# Patient Record
Sex: Male | Born: 1935 | Race: White | Hispanic: No | Marital: Married | Smoking: Never smoker
Health system: Southern US, Community
[De-identification: ages and names within clinical notes are randomized; demographics above are authoritative.]

## PROBLEM LIST (undated history)

## (undated) DIAGNOSIS — E7800 Pure hypercholesterolemia, unspecified: Secondary | ICD-10-CM

## (undated) DIAGNOSIS — L97509 Non-pressure chronic ulcer of other part of unspecified foot with unspecified severity: Secondary | ICD-10-CM

## (undated) DIAGNOSIS — I2510 Atherosclerotic heart disease of native coronary artery without angina pectoris: Secondary | ICD-10-CM

## (undated) DIAGNOSIS — C801 Malignant (primary) neoplasm, unspecified: Secondary | ICD-10-CM

## (undated) DIAGNOSIS — E119 Type 2 diabetes mellitus without complications: Secondary | ICD-10-CM

## (undated) DIAGNOSIS — E11621 Type 2 diabetes mellitus with foot ulcer: Secondary | ICD-10-CM

## (undated) DIAGNOSIS — E1140 Type 2 diabetes mellitus with diabetic neuropathy, unspecified: Secondary | ICD-10-CM

## (undated) DIAGNOSIS — I4891 Unspecified atrial fibrillation: Secondary | ICD-10-CM

## (undated) DIAGNOSIS — I10 Essential (primary) hypertension: Secondary | ICD-10-CM

## (undated) HISTORY — PX: PACEMAKER PLACEMENT: SHX43

## (undated) HISTORY — PX: CORONARY ARTERY BYPASS GRAFT: SHX141

## (undated) HISTORY — PX: TOTAL HIP ARTHROPLASTY: SHX124

## (undated) HISTORY — PX: CORONARY STENT PLACEMENT: SHX1402

---

## 2014-02-09 ENCOUNTER — Encounter (HOSPITAL_BASED_OUTPATIENT_CLINIC_OR_DEPARTMENT_OTHER): Payer: Self-pay | Admitting: Emergency Medicine

## 2014-02-09 ENCOUNTER — Emergency Department (HOSPITAL_BASED_OUTPATIENT_CLINIC_OR_DEPARTMENT_OTHER): Payer: Medicare Other

## 2014-02-09 ENCOUNTER — Emergency Department (HOSPITAL_BASED_OUTPATIENT_CLINIC_OR_DEPARTMENT_OTHER)
Admission: EM | Admit: 2014-02-09 | Discharge: 2014-02-09 | Disposition: A | Discharge status: Home / Self Care | Payer: Medicare Other | Attending: Emergency Medicine | Admitting: Emergency Medicine

## 2014-02-09 DIAGNOSIS — I6782 Cerebral ischemia: Secondary | ICD-10-CM | POA: Insufficient documentation

## 2014-02-09 DIAGNOSIS — W01198A Fall on same level from slipping, tripping and stumbling with subsequent striking against other object, initial encounter: Secondary | ICD-10-CM | POA: Insufficient documentation

## 2014-02-09 DIAGNOSIS — E1140 Type 2 diabetes mellitus with diabetic neuropathy, unspecified: Secondary | ICD-10-CM | POA: Diagnosis not present

## 2014-02-09 DIAGNOSIS — Z7951 Long term (current) use of inhaled steroids: Secondary | ICD-10-CM | POA: Diagnosis not present

## 2014-02-09 DIAGNOSIS — Z792 Long term (current) use of antibiotics: Secondary | ICD-10-CM | POA: Diagnosis not present

## 2014-02-09 DIAGNOSIS — Y9389 Activity, other specified: Secondary | ICD-10-CM | POA: Diagnosis not present

## 2014-02-09 DIAGNOSIS — S52042A Displaced fracture of coronoid process of left ulna, initial encounter for closed fracture: Secondary | ICD-10-CM | POA: Diagnosis not present

## 2014-02-09 DIAGNOSIS — Z79899 Other long term (current) drug therapy: Secondary | ICD-10-CM | POA: Insufficient documentation

## 2014-02-09 DIAGNOSIS — S6992XA Unspecified injury of left wrist, hand and finger(s), initial encounter: Secondary | ICD-10-CM | POA: Diagnosis not present

## 2014-02-09 DIAGNOSIS — I10 Essential (primary) hypertension: Secondary | ICD-10-CM | POA: Insufficient documentation

## 2014-02-09 DIAGNOSIS — I4891 Unspecified atrial fibrillation: Secondary | ICD-10-CM | POA: Insufficient documentation

## 2014-02-09 DIAGNOSIS — S59902A Unspecified injury of left elbow, initial encounter: Secondary | ICD-10-CM | POA: Diagnosis present

## 2014-02-09 DIAGNOSIS — Y92128 Other place in nursing home as the place of occurrence of the external cause: Secondary | ICD-10-CM | POA: Insufficient documentation

## 2014-02-09 DIAGNOSIS — E780 Pure hypercholesterolemia: Secondary | ICD-10-CM | POA: Diagnosis not present

## 2014-02-09 DIAGNOSIS — W19XXXA Unspecified fall, initial encounter: Secondary | ICD-10-CM

## 2014-02-09 DIAGNOSIS — Z7901 Long term (current) use of anticoagulants: Secondary | ICD-10-CM | POA: Insufficient documentation

## 2014-02-09 HISTORY — DX: Unspecified atrial fibrillation: I48.91

## 2014-02-09 HISTORY — DX: Type 2 diabetes mellitus with foot ulcer: E11.621

## 2014-02-09 HISTORY — DX: Non-pressure chronic ulcer of other part of unspecified foot with unspecified severity: L97.509

## 2014-02-09 HISTORY — DX: Type 2 diabetes mellitus without complications: E11.9

## 2014-02-09 HISTORY — DX: Type 2 diabetes mellitus with diabetic neuropathy, unspecified: E11.40

## 2014-02-09 HISTORY — DX: Essential (primary) hypertension: I10

## 2014-02-09 HISTORY — DX: Pure hypercholesterolemia, unspecified: E78.00

## 2014-02-09 NOTE — ED Notes (Signed)
Pt refused PT with INR.  Pt states he had it drawn last week and it was 2.2  Pt states he normally runs 2.2 to 2.4 and there was no changes in his Coumadin.  Robin notified.

## 2014-02-09 NOTE — ED Notes (Signed)
Patient requested that PT not be drawn.  RN aware

## 2014-02-09 NOTE — ED Notes (Signed)
Pt to room 6 by ems via stretcher. Pt reports getting his foot caught in a rug, tripping and falling just pta. Denies hitting head or loc, skin tear noted to left elbow, with redness and swellilng. 3 steri strips noted, pt states were placed by staff at his independent living facility. Pt states he requested transport here for an xray of the elbow. Pt denies any other injury or c/o, a/a/ox4 in nad.

## 2014-02-09 NOTE — Discharge Instructions (Signed)
Follow-up with your orthopedist within 48 hours.  Cast or Splint Care Casts and splints support injured limbs and keep bones from moving while they heal. It is important to care for your cast or splint at home.  HOME CARE INSTRUCTIONS  Keep the cast or splint uncovered during the drying period. It can take 24 to 48 hours to dry if it is made of plaster. A fiberglass cast will dry in less than 1 hour.  Do not rest the cast on anything harder than a pillow for the first 24 hours.  Do not put weight on your injured limb or apply pressure to the cast until your health care provider gives you permission.  Keep the cast or splint dry. Wet casts or splints can lose their shape and may not support the limb as well. A wet cast that has lost its shape can also create harmful pressure on your skin when it dries. Also, wet skin can become infected.  Cover the cast or splint with a plastic bag when bathing or when out in the rain or snow. If the cast is on the trunk of the body, take sponge baths until the cast is removed.  If your cast does become wet, dry it with a towel or a blow dryer on the cool setting only.  Keep your cast or splint clean. Soiled casts may be wiped with a moistened cloth.  Do not place any hard or soft foreign objects under your cast or splint, such as cotton, toilet paper, lotion, or powder.  Do not try to scratch the skin under the cast with any object. The object could get stuck inside the cast. Also, scratching could lead to an infection. If itching is a problem, use a blow dryer on a cool setting to relieve discomfort.  Do not trim or cut your cast or remove padding from inside of it.  Exercise all joints next to the injury that are not immobilized by the cast or splint. For example, if you have a long leg cast, exercise the hip joint and toes. If you have an arm cast or splint, exercise the shoulder, elbow, thumb, and fingers.  Elevate your injured arm or leg on 1 or 2  pillows for the first 1 to 3 days to decrease swelling and pain.It is best if you can comfortably elevate your cast so it is higher than your heart. SEEK MEDICAL CARE IF:   Your cast or splint cracks.  Your cast or splint is too tight or too loose.  You have unbearable itching inside the cast.  Your cast becomes wet or develops a soft spot or area.  You have a bad smell coming from inside your cast.  You get an object stuck under your cast.  Your skin around the cast becomes red or raw.  You have new pain or worsening pain after the cast has been applied. SEEK IMMEDIATE MEDICAL CARE IF:   You have fluid leaking through the cast.  You are unable to move your fingers or toes.  You have discolored (blue or white), cool, painful, or very swollen fingers or toes beyond the cast.  You have tingling or numbness around the injured area.  You have severe pain or pressure under the cast.  You have any difficulty with your breathing or have shortness of breath.  You have chest pain. Document Released: 03/30/2000 Document Revised: 01/21/2013 Document Reviewed: 10/09/2012 [facility] Patient Information 2015 [place], [place]. This information is not intended to replace  advice given to you by your health care provider. Make sure you discuss any questions you have with your health care provider.  Elbow Fracture, Simple A fracture is a break in one of the bones.When fractures are not displaced or separated, they may be treated with only a sling or splint. The sling or splint may only be required for two to three weeks. In these cases, often the elbow is put through early range of motion exercises to prevent the elbow from getting stiff. DIAGNOSIS  The diagnosis (learning what is wrong) of a fractured elbow is made by x-ray. These may be required before and after the elbow is put into a splint or cast. X-rays are taken after to make sure the bone pieces have not moved. HOME CARE INSTRUCTIONS    Only take over-the-counter or prescription medicines for pain, discomfort, or fever as directed by your caregiver.  If you have a splint held on with an elastic wrap and your hand or fingers become numb or cold and blue, loosen the wrap and reapply more loosely. See your caregiver if there is no relief.  You may use ice for twenty minutes, four times per day, for the first two to three days.  Use your elbow as directed.  See your caregiver as directed. It is very important to keep all follow-up referrals and appointments in order to avoid any long-term problems with your elbow including chronic pain or stiffness. SEEK IMMEDIATE MEDICAL CARE IF:   There is swelling or increasing pain in elbow.  You begin to lose feeling or experience numbness or tingling in your hand or fingers.  You develop swelling of the hand and fingers.  You get a cold or blue hand or fingers on affected side. MAKE SURE YOU:   Understand these instructions.  Will watch your condition.  Will get help right away if you are not doing well or get worse. Document Released: 03/27/2001 Document Revised: 06/25/2011 Document Reviewed: 02/15/2009 [facility] Patient Information 2015 [place], [place]. This information is not intended to replace advice given to you by your health care provider. Make sure you discuss any questions you have with your health care provider.  Forearm Fracture Your caregiver has diagnosed you as having a broken bone (fracture) of the forearm. This is the part of your arm between the elbow and your wrist. Your forearm is made up of two bones. These are the radius and ulna. A fracture is a break in one or both bones. A cast or splint is used to protect and keep your injured bone from moving. The cast or splint will be on generally for about 5 to 6 weeks, with individual variations. HOME CARE INSTRUCTIONS   Keep the injured part elevated while sitting or lying down. Keeping the injury above the level  of your heart (the center of the chest). This will decrease swelling and pain.  Apply ice to the injury for 15-20 minutes, 03-04 times per day while awake, for 2 days. Put the ice in a plastic bag and place a thin towel between the bag of ice and your cast or splint.  If you have a plaster or fiberglass cast:  Do not try to scratch the skin under the cast using sharp or pointed objects.  Check the skin around the cast every day. You may put lotion on any red or sore areas.  Keep your cast dry and clean.  If you have a plaster splint:  Wear the splint as directed.  You may loosen the elastic around the splint if your fingers become numb, tingle, or turn cold or blue.  Do not put pressure on any part of your cast or splint. It may break. Rest your cast only on a pillow the first 24 hours until it is fully hardened.  Your cast or splint can be protected during bathing with a plastic bag. Do not lower the cast or splint into water.  Only take over-the-counter or prescription medicines for pain, discomfort, or fever as directed by your caregiver. SEEK IMMEDIATE MEDICAL CARE IF:   Your cast gets damaged or breaks.  You have more severe pain or swelling than you did before the cast.  Your skin or nails below the injury turn blue or gray, or feel cold or numb.  There is a bad smell or new stains and/or pus like (purulent) drainage coming from under the cast. MAKE SURE YOU:   Understand these instructions.  Will watch your condition.  Will get help right away if you are not doing well or get worse. Document Released: 03/30/2000 Document Revised: 06/25/2011 Document Reviewed: 11/20/2007 [facility] Patient Information 2015 [place], [place]. This information is not intended to replace advice given to you by your health care provider. Make sure you discuss any questions you have with your health care provider.

## 2014-02-09 NOTE — ED Provider Notes (Signed)
CSN: [id number]     Arrival date & time 02/09/14  1744 History   First MD Initiated Contact with Patient 02/09/14 1746     Chief Complaint  Patient presents with  . Fall     (Consider location/radiation/quality/duration/timing/severity/associated sxs/prior Treatment) HPI Comments: This is an 80 year old male with a past medical history of atrial fibrillation, diabetes, hypertension and hypercholesterolemia who presents to the emergency department via EMS from an independent living facility after a mechanical fall this evening. Patient reports the boot on his left foot got caught on the rug causing him to trip and fall just prior to arrival. Patient denies hitting his head or loss of consciousness. States he landed onto his left arm and is now experiencing pain to his elbow and wrist. States his pain is "uncomfortable". He had a small abrasion to his left elbow at the facility placed 3 Steri-Strips on. Patient states it is protocol for the facility to have patient evaluated if they are on Coumadin. Denies numbness or tingling. Denies lightheadedness, weakness, dizziness, chest pain or shortness of breath.  Patient is a 80 y.o. male presenting with fall. The history is provided by the patient and the EMS personnel.  Fall    Past Medical History  Diagnosis Date  . Hypertension   . Diabetes mellitus without complication   . Hypercholesteremia   . Atrial fibrillation   . Neuropathic diabetic ulcer of foot    History reviewed. No pertinent past surgical history. History reviewed. No pertinent family history. History  Substance Use Topics  . Smoking status: Never Smoker   . Smokeless tobacco: Not on file  . Alcohol Use: Not on file    Review of Systems  Musculoskeletal:       + L elbow and wrist pain.  Skin: Positive for wound.  All other systems reviewed and are negative.     Allergies  Review of patient's allergies indicates no known allergies.  Home Medications   Prior to  Admission medications   Medication Sig Start Date End Date Taking? Authorizing Provider  acitretin (SORIATANE) 25 MG capsule Take 25 mg by mouth daily before breakfast.   Yes Historical Provider, MD  beta carotene w/minerals (OCUVITE) tablet Take 1 tablet by mouth 2 (two) times daily.   Yes Historical Provider, MD  fluticasone (FLONASE) 50 MCG/ACT nasal spray Place 1 spray into both nostrils daily.   Yes Historical Provider, MD  furosemide (LASIX) 40 MG tablet Take 40 mg by mouth.   Yes Historical Provider, MD  HYDROcodone-acetaminophen (NORCO/VICODIN) 5-325 MG per tablet Take 1 tablet by mouth every 6 (six) hours as needed for moderate pain.   Yes Historical Provider, MD  linagliptin (TRADJENTA) 5 MG TABS tablet Take 5 mg by mouth daily.   Yes Historical Provider, MD  lisinopril (PRINIVIL,ZESTRIL) 20 MG tablet Take 20 mg by mouth daily.   Yes Historical Provider, MD  metoprolol (LOPRESSOR) 100 MG tablet Take 100 mg by mouth 2 (two) times daily.   Yes Historical Provider, MD  Multiple Vitamin (MULTIVITAMIN) tablet Take 1 tablet by mouth daily.   Yes Historical Provider, MD  mupirocin ointment (BACTROBAN) 2 % Place 1 application into the nose daily. TO WOUND   Yes Historical Provider, MD  potassium chloride (MICRO-K) 10 MEQ CR capsule Take 10 mEq by mouth daily.   Yes Historical Provider, MD  pravastatin (PRAVACHOL) 20 MG tablet Take 20 mg by mouth daily.   Yes Historical Provider, MD  tamsulosin (FLOMAX) 0.4 MG CAPS capsule Take  0.4 mg by mouth daily.   Yes Historical Provider, MD  tiotropium (SPIRIVA) 18 MCG inhalation capsule Place 18 mcg into inhaler and inhale daily.   Yes Historical Provider, MD  warfarin (COUMADIN) 5 MG tablet Take 5 mg by mouth daily. FIVE DAYS PER WEEK, Tuesday AND Thursday 2.5MG    Yes Historical Provider, MD   BP 162/99  Pulse 94  Temp(Src) 98.3 F (36.8 C) (Oral)  Resp 20  Ht 5\' 11"  (1.803 m)  Wt 200 lb (90.719 kg)  BMI 27.91 kg/m2  SpO2 96% Physical Exam   Nursing note and vitals reviewed. Constitutional: He is oriented to person, place, and time. He appears well-developed and well-nourished. No distress.  HENT:  Head: Normocephalic and atraumatic.  Mouth/Throat: Oropharynx is clear and moist.  Eyes: Conjunctivae and EOM are normal. Pupils are equal, round, and reactive to light.  Neck: Normal range of motion. Neck supple. No JVD present.  Cardiovascular: Normal rate, regular rhythm, normal heart sounds and intact distal pulses.   Pulses:      Radial pulses are 2+ on the left side.  No extremity edema.  Pulmonary/Chest: Effort normal and breath sounds normal. No respiratory distress.  Abdominal: Soft. Bowel sounds are normal. There is no tenderness.  Musculoskeletal: Normal range of motion.  L wrist mildly tender. No swelling or deformity. FROM. L elbow tender at olecranon process with small abrasion. Bleeding controlled. Mild swelling. FROM. No tenderness to forearm. L shoulder normal.  Neurological: He is alert and oriented to person, place, and time. He has normal strength. No sensory deficit.  Speech fluent, goal oriented. Moves limbs without ataxia. Equal grip strength bilateral.  Skin: Skin is warm and dry. He is not diaphoretic.  Psychiatric: He has a normal mood and affect. His behavior is normal.    ED Course  Procedures (including critical care time) SPLINT APPLICATION Date/Time: 0:35 PM Authorized by: Lucien Mons Consent: Verbal consent obtained. Risks and benefits: risks, benefits and alternatives were discussed Consent given by: patient Splint applied by: orthopedic technician Location details: left arm Splint type: posterior long-arm Supplies used: orthoglass Post-procedure: The splinted body part was neurovascularly unchanged following the procedure. Patient tolerance: Patient tolerated the procedure well with no immediate complications.    Labs Review Labs Reviewed - No data to display  Imaging Review Dg  Elbow Complete Left  02/09/2014   CLINICAL DATA:  Left elbow pain after fall.  Initial encounter  EXAM: LEFT ELBOW - COMPLETE 3+ VIEW  COMPARISON:  None.  FINDINGS: Positive for joint effusion. There is an unexpected lucency through the coronoid process of the proximal ulna. No displaced fragment. Located elbow joint.  Chronic insertional changes to the bilateral supracondylar humerus attachments.  IMPRESSION: 1. Probable nondisplaced coronoid process fracture. 2. Elbow joint effusion.   Electronically Signed   By: Jorje Guild M.D.   On: 02/09/2014 18:39   Dg Wrist Complete Left  02/09/2014   CLINICAL DATA:  Fall onto wrist. Left wrist injury and pain. Initial encounter.  EXAM: LEFT WRIST - COMPLETE 3+ VIEW  COMPARISON:  None.  FINDINGS: There is no evidence of fracture or dislocation. There is no evidence of arthropathy or other focal bone abnormality. Soft tissues are unremarkable.  IMPRESSION: Negative.   Electronically Signed   By: Earle Gell M.D.   On: 02/09/2014 18:37   Ct Head Wo Contrast  02/09/2014   CLINICAL DATA:  Fall.  EXAM: CT HEAD WITHOUT CONTRAST  TECHNIQUE: Contiguous axial images were obtained from the  base of the skull through the vertex without intravenous contrast.  COMPARISON:  None.  FINDINGS: Prominence of the sulci and ventricles identified consistent with brain atrophy. Calcification is identified within the left parietal lobe. There is low attenuation throughout the subcortical and periventricular white matter consistent with small vessel ischemic disease and brain atrophy. There is no acute brain infarct, hemorrhage or mass. No abnormal extra-axial fluid collections identified. The paranasal sinuses are clear. The mastoid air cells are clear. The skull is intact.  IMPRESSION: 1. No acute intracranial abnormalities. 2. Small vessel ischemic disease and brain atrophy.   Electronically Signed   By: Kerby Moors M.D.   On: 02/09/2014 18:36     EKG Interpretation None       MDM   Final diagnoses:  Fall  Fracture of coronoid process of left ulna, closed, initial encounter   Pt presenting after mechanical fall. He is well appearing and in NAD. Neurovascularly intact. Head CT obtained per Dr. Aline Brochure given pt is on coumadin, no acute findings. L wrist xray normal. L elbow xray with probable nondisplaced coronoid process fracture. Posterior long arm splint applied along with sling. He has an orthopedist who he will follow up with. Stable for d/c back to independent living facility. He lives with his wife. Refuses pain medication. Return precautions given. Patient states understanding of treatment care plan and is agreeable.  Case discussed with attending Dr. Aline Brochure who also evaluated patient and agrees with plan of care.   Carman Ching, PA-C 02/09/14 2142

## 2014-02-10 NOTE — ED Provider Notes (Signed)
Medical screening examination/treatment/procedure(s) were conducted as a shared visit with non-physician practitioner(s) and myself.  I personally evaluated the patient during the encounter.   EKG Interpretation None      I interviewed and examined the patient. Lungs are CTAB. Cardiac exam wnl. Abdomen soft.  Found to have coronoid process fx. Will place in splint and rec ortho f/u.   Pamella Pert, MD 02/10/14 1438

## 2016-10-29 ENCOUNTER — Emergency Department (HOSPITAL_BASED_OUTPATIENT_CLINIC_OR_DEPARTMENT_OTHER): Payer: Medicare Other

## 2016-10-29 ENCOUNTER — Emergency Department (HOSPITAL_BASED_OUTPATIENT_CLINIC_OR_DEPARTMENT_OTHER)
Admission: EM | Admit: 2016-10-29 | Discharge: 2016-10-29 | Disposition: A | Discharge status: Home / Self Care | Payer: Medicare Other | Attending: Emergency Medicine | Admitting: Emergency Medicine

## 2016-10-29 ENCOUNTER — Encounter (HOSPITAL_BASED_OUTPATIENT_CLINIC_OR_DEPARTMENT_OTHER): Payer: Self-pay | Admitting: *Deleted

## 2016-10-29 DIAGNOSIS — S0083XA Contusion of other part of head, initial encounter: Secondary | ICD-10-CM | POA: Insufficient documentation

## 2016-10-29 DIAGNOSIS — Z951 Presence of aortocoronary bypass graft: Secondary | ICD-10-CM | POA: Insufficient documentation

## 2016-10-29 DIAGNOSIS — S022XXA Fracture of nasal bones, initial encounter for closed fracture: Secondary | ICD-10-CM | POA: Insufficient documentation

## 2016-10-29 DIAGNOSIS — I10 Essential (primary) hypertension: Secondary | ICD-10-CM | POA: Insufficient documentation

## 2016-10-29 DIAGNOSIS — I2510 Atherosclerotic heart disease of native coronary artery without angina pectoris: Secondary | ICD-10-CM | POA: Insufficient documentation

## 2016-10-29 DIAGNOSIS — Z79899 Other long term (current) drug therapy: Secondary | ICD-10-CM | POA: Insufficient documentation

## 2016-10-29 DIAGNOSIS — E119 Type 2 diabetes mellitus without complications: Secondary | ICD-10-CM | POA: Insufficient documentation

## 2016-10-29 DIAGNOSIS — T45515A Adverse effect of anticoagulants, initial encounter: Secondary | ICD-10-CM | POA: Diagnosis not present

## 2016-10-29 DIAGNOSIS — W0110XA Fall on same level from slipping, tripping and stumbling with subsequent striking against unspecified object, initial encounter: Secondary | ICD-10-CM | POA: Diagnosis not present

## 2016-10-29 DIAGNOSIS — D6832 Hemorrhagic disorder due to extrinsic circulating anticoagulants: Secondary | ICD-10-CM | POA: Insufficient documentation

## 2016-10-29 DIAGNOSIS — W010XXA Fall on same level from slipping, tripping and stumbling without subsequent striking against object, initial encounter: Secondary | ICD-10-CM

## 2016-10-29 DIAGNOSIS — Z950 Presence of cardiac pacemaker: Secondary | ICD-10-CM | POA: Insufficient documentation

## 2016-10-29 DIAGNOSIS — Y929 Unspecified place or not applicable: Secondary | ICD-10-CM | POA: Insufficient documentation

## 2016-10-29 DIAGNOSIS — Y999 Unspecified external cause status: Secondary | ICD-10-CM | POA: Diagnosis not present

## 2016-10-29 DIAGNOSIS — Y939 Activity, unspecified: Secondary | ICD-10-CM | POA: Insufficient documentation

## 2016-10-29 DIAGNOSIS — S0992XA Unspecified injury of nose, initial encounter: Secondary | ICD-10-CM | POA: Diagnosis present

## 2016-10-29 DIAGNOSIS — Z859 Personal history of malignant neoplasm, unspecified: Secondary | ICD-10-CM | POA: Insufficient documentation

## 2016-10-29 DIAGNOSIS — Z7901 Long term (current) use of anticoagulants: Secondary | ICD-10-CM | POA: Diagnosis not present

## 2016-10-29 HISTORY — DX: Malignant (primary) neoplasm, unspecified: C80.1

## 2016-10-29 HISTORY — DX: Atherosclerotic heart disease of native coronary artery without angina pectoris: I25.10

## 2016-10-29 MED ORDER — ACETAMINOPHEN 325 MG PO TABS
650.0000 mg | ORAL_TABLET | Freq: Once | ORAL | Status: AC
  Administered 2016-10-29: 650 mg via ORAL
  Filled 2016-10-29: qty 2

## 2016-10-29 NOTE — ED Provider Notes (Signed)
[facility] Provider Note   CSN: [id number] Arrival date & time: 10/29/16  2117   By signing my name below, I, Soijett Blue, attest that this documentation has been prepared under the direction and in the presence of Lajean Saver, MD. Electronically Signed: [place], ED Scribe. 10/29/16. 10:57 PM.  History   Chief Complaint Chief Complaint  Patient presents with  . Fall    HPI Jeffrey Bowman is a 83 y.o. male with a PMHx of DM, HTN, who presents to the Emergency Department complaining of hitting his head and nose bleeding s/p fall onset PTA. Pt reports associated abrasion to his forehead. Pt has not tried any medications for the relief of his symptoms. He notes that he tripped and fell which caused him to strike his head on the floor. He notes that he is on daily coumadin due to bovine valve replacement x 18 years ago, stent placement, and MI. Denies lightheadedness, dizziness, nausea, vomiting, LOC, neck pain, and any other symptoms.     The history is provided by the patient. No language interpreter was used.    Past Medical History:  Diagnosis Date  . Atrial fibrillation ([place])   . Cancer ([place])   . Coronary artery disease   . Diabetes mellitus without complication ([place])   . Hypercholesteremia   . Hypertension   . Neuropathic diabetic ulcer of foot ([place])     There are no active problems to display for this patient.   Past Surgical History:  Procedure Laterality Date  . CORONARY ARTERY BYPASS GRAFT    . CORONARY STENT PLACEMENT    . PACEMAKER PLACEMENT    . TOTAL HIP ARTHROPLASTY         Home Medications    Prior to Admission medications   Medication Sig Start Date End Date Taking? Authorizing Provider  beta carotene w/minerals (OCUVITE) tablet Take 1 tablet by mouth 2 (two) times daily.   Yes [provider]  furosemide (LASIX) 40 MG tablet Take 40 mg by mouth.   Yes [provider]  lisinopril (PRINIVIL,ZESTRIL) 20 MG tablet  Take 20 mg by mouth daily.   Yes [provider]  metoprolol (LOPRESSOR) 100 MG tablet Take 100 mg by mouth 2 (two) times daily.   Yes [provider]  Multiple Vitamin (MULTIVITAMIN) tablet Take 1 tablet by mouth daily.   Yes [provider]  potassium chloride (MICRO-K) 10 MEQ CR capsule Take 10 mEq by mouth daily.   Yes [provider]  pravastatin (PRAVACHOL) 20 MG tablet Take 20 mg by mouth daily.   Yes [provider]  tiotropium (SPIRIVA) 18 MCG inhalation capsule Place 18 mcg into inhaler and inhale daily.   Yes [provider]  warfarin (COUMADIN) 5 MG tablet Take 5 mg by mouth daily. FIVE DAYS PER WEEK, Tuesday AND Thursday 2.5MG    Yes [provider]  acitretin (SORIATANE) 25 MG capsule Take 25 mg by mouth daily before breakfast.    [provider]  fluticasone (FLONASE) 50 MCG/ACT nasal spray Place 1 spray into both nostrils daily.    [provider]  HYDROcodone-acetaminophen (NORCO/VICODIN) 5-325 MG per tablet Take 1 tablet by mouth every 6 (six) hours as needed for moderate pain.    [provider]  linagliptin (TRADJENTA) 5 MG TABS tablet Take 5 mg by mouth daily.    [provider]  mupirocin ointment (BACTROBAN) 2 % Place 1 application into the nose daily. TO WOUND    [provider]  tamsulosin (FLOMAX) 0.4 MG CAPS capsule Take 0.4 mg by mouth daily.    [provider]    Family History No family history on file.  Social History Social History  Substance Use Topics  . Smoking status: Never Smoker  . Smokeless tobacco: Never Used  . Alcohol use No     Allergies   Patient has no known allergies.   Review of Systems Review of Systems  Constitutional: Negative for fever.  HENT: Positive for nosebleeds.   Eyes: Negative for pain and visual disturbance.  Respiratory: Negative for shortness of breath.   Cardiovascular: Negative for chest pain.    Gastrointestinal: Negative for nausea and vomiting.  Genitourinary: Negative for flank pain.  Musculoskeletal: Negative for back pain and neck pain.  Skin: Positive for wound (abrasion to forehead).  Neurological: Negative for dizziness, syncope and light-headedness.  Hematological: Bruises/bleeds easily.       On coumadin  Psychiatric/Behavioral: Negative for confusion.     Physical Exam Updated Vital Signs BP (!) 155/76   Pulse 75   Temp 98.1 F (36.7 C) (Oral)   Resp (!) 22   Ht 5\' 11"  (1.803 m)   Wt 200 lb (90.7 kg)   SpO2 96%   BMI 27.89 kg/m   Physical Exam  Constitutional: He is oriented to person, place, and time. He appears well-developed and well-nourished. No distress.  HENT:  Head: Normocephalic. Head is with abrasion.  Nose: No nasal septal hematoma.  Contusion and swelling to nose. Dried blood both nares. No septal hematoma. Abrasion to forehead.  Eyes: Pupils are equal, round, and reactive to light. EOM are normal.  Neck: Normal range of motion. Neck supple.  Cardiovascular: Normal rate, regular rhythm and normal heart sounds.  Exam reveals no gallop and no friction rub.   No murmur heard. Pulmonary/Chest: Effort normal and breath sounds normal. No respiratory distress. He has no wheezes. He has no rales.  Abdominal: Soft. He exhibits no distension. There is no tenderness.  Musculoskeletal: He exhibits no edema or tenderness.  CTLS spine, non tender, aligned, no step off. Good rom bil ext, no focal bony tenderness.   Neurological: He is alert and oriented to person, place, and time.  Speech clear/fluent. Ambulates w steady gait.   Skin: Skin is warm and dry.  Psychiatric: He has a normal mood and affect. His behavior is normal.  Nursing note and vitals reviewed.    ED Treatments / Results  DIAGNOSTIC STUDIES: Oxygen Saturation is 96% on RA, nl by my interpretation.    COORDINATION OF CARE: 9:50 PM Discussed treatment plan with pt at bedside and pt  agreed to plan.   Labs (all labs ordered are listed, but only abnormal results are displayed) Labs Reviewed - No data to display  EKG  EKG Interpretation None       Radiology Ct Head Wo Contrast  Result Date: 10/29/2016 CLINICAL DATA:  Fall onto carpeted floor.  Currently on Coumadin. EXAM: CT HEAD WITHOUT CONTRAST TECHNIQUE: Contiguous axial images were obtained from the base of the skull through the vertex without intravenous contrast. COMPARISON:  Head CT 02/09/2014 FINDINGS: Brain: No mass lesion, intraparenchymal hemorrhage or extra-axial collection. No evidence of acute cortical infarct. There is periventricular hypoattenuation compatible with chronic microvascular disease. Vascular: No hyperdense vessel or unexpected calcification. Skull: There is a frontal scalp abrasion. There is a minimally medially displaced fracture of the anterior right nasal bone. No calvarial fracture. Sinuses/Orbits: No sinus fluid levels or  advanced mucosal thickening. No mastoid effusion. Normal orbits. IMPRESSION: 1. No acute intracranial abnormality. 2. Frontal scalp and anterior nasal abrasion with fracture of the anterior right nasal bone. 3. Chronic microvascular ischemia and volume loss. Electronically Signed   By: Ulyses Jarred M.D.   On: 10/29/2016 22:43    Procedures Procedures (including critical care time)  Medications Ordered in ED Medications - No data to display   Initial Impression / Assessment and Plan / ED Course  I have reviewed the triage vital signs and the nursing notes.  Pertinent labs & imaging results that were available during my care of the patient were reviewed by me and considered in my medical decision making (see chart for details).  Pt on coumadin, fall w large contusion to forehead/pain to area - will get ct.  Ct neg acute. Discussed w pt.  Reviewed nursing notes and prior charts for additional history.     Final Clinical Impressions(s) / ED Diagnoses   Final  diagnoses:  None    New Prescriptions New Prescriptions   No medications on file   I personally performed the services described in this documentation, which was scribed in my presence. The recorded information has been reviewed and considered. Lajean Saver, MD      Lajean Saver, MD 10/29/16 [phone]

## 2016-10-29 NOTE — Discharge Instructions (Signed)
It was our pleasure to provide your ER care today - we hope that you feel better.  The scan shows a nasal bone fracture.  Keep head elevated, even while sleeping, to help with pain/swelling.  Icepack/cold to sore area.  Take acetaminophen as need for pain.  Follow up with your doctor.  Return to ER if worse, new symptoms, new or severe pain, severe headache, other concern.

## 2016-10-29 NOTE — ED Triage Notes (Addendum)
He tripped and fell. Injury to his nose. Dried blood in his nares. No active bleeding at noted at this time.  Abrasion to his forehead. Bruising to his left forearm. No pain in his head. He takes Coumadin.

## 2016-10-29 NOTE — ED Notes (Signed)
Pt returned from CT via stretcher.

## 2018-10-02 IMAGING — CT CT HEAD W/O CM
3 series · 14 of 47 positions shown, 16 images · non-contrast
Comparison: Head CT 02/09/2014

CLINICAL DATA: Fall onto carpeted floor.  Currently on Coumadin.

EXAM:
CT HEAD WITHOUT CONTRAST
TECHNIQUE: Contiguous axial images were obtained from the base of the skull
through the vertex without intravenous contrast.

[Series 2: head wo · axial · 0.45mm/px · z∈[-191,-46]mm · 8 of 35 slices shown, 10 images]
[im 3/35  brain]
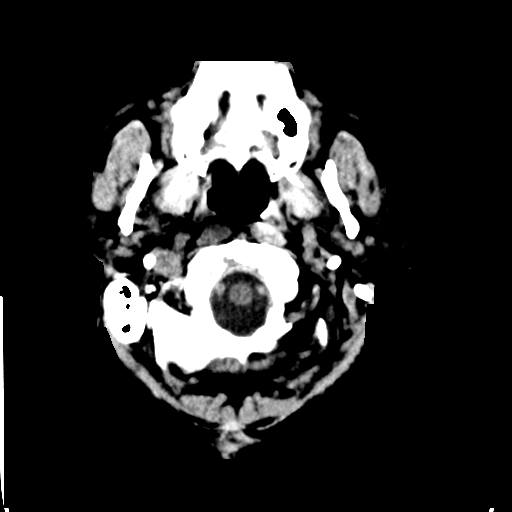
[im 3/35  bone]
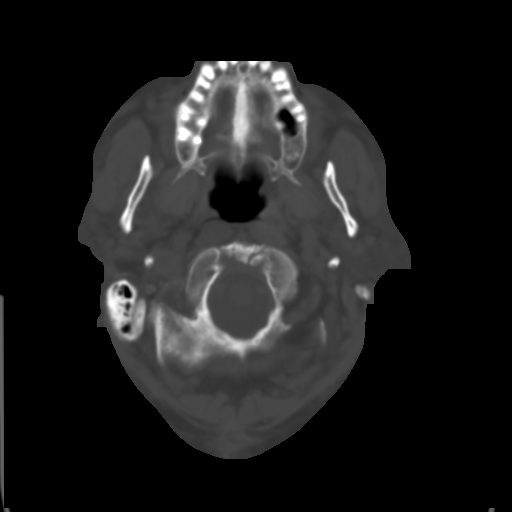
[im 8/35  brain]
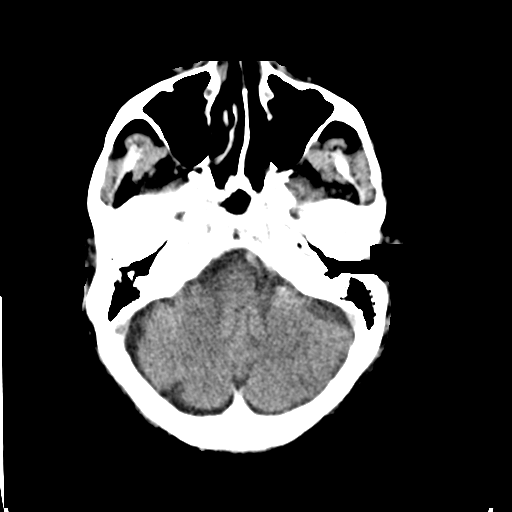
[im 11/35  brain]
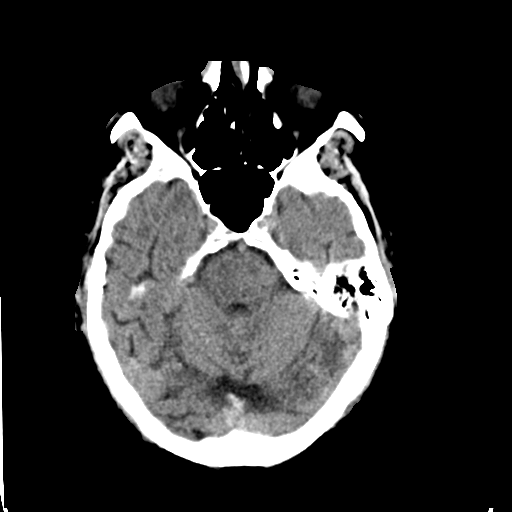
[im 16/35  brain]
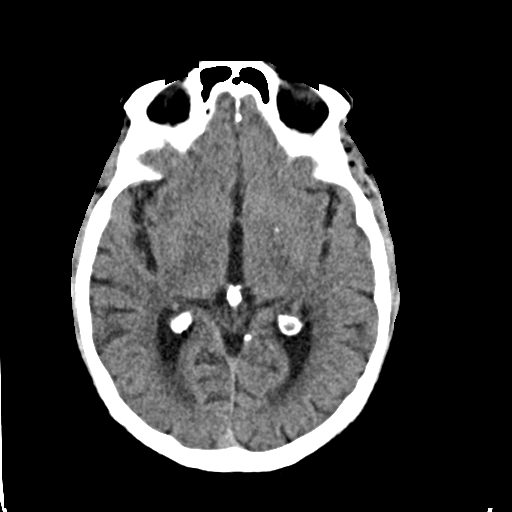
[im 19/35  brain]
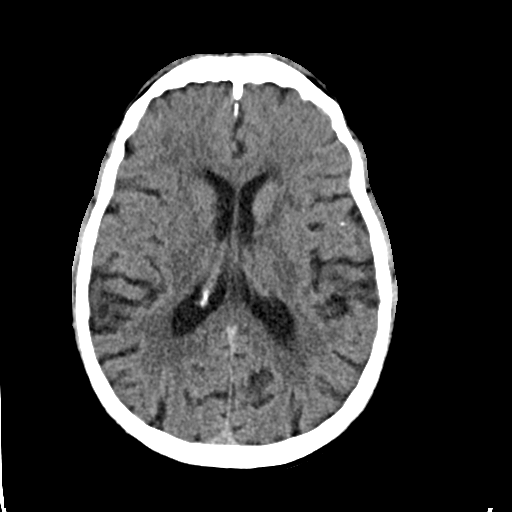
[im 19/35  bone]
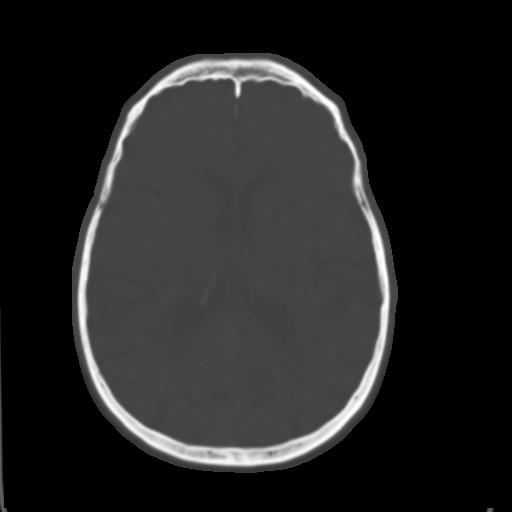
[im 24/35  brain]
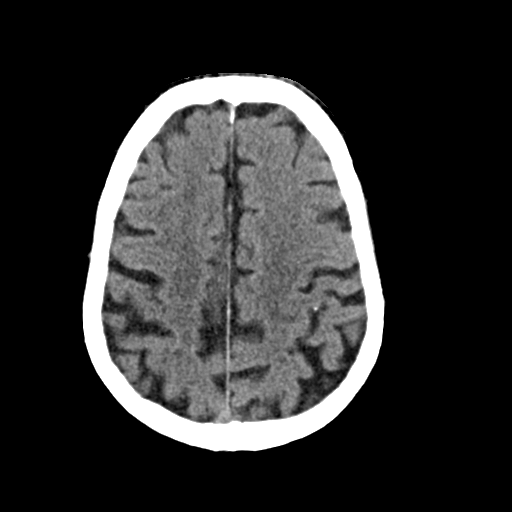
[im 27/35  brain]
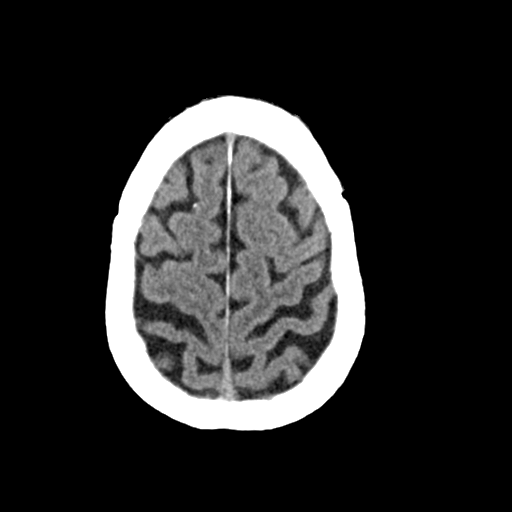
[im 32/35  brain]
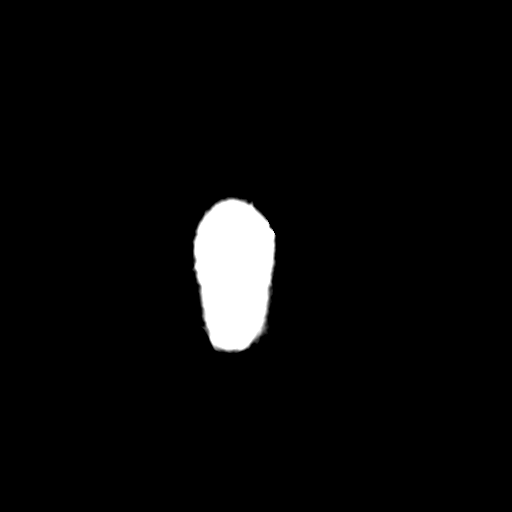

[Series 4: coronal soft · coronal · 0.34mm/px · 3 of 77 slices shown]
[im 26/77  brain]
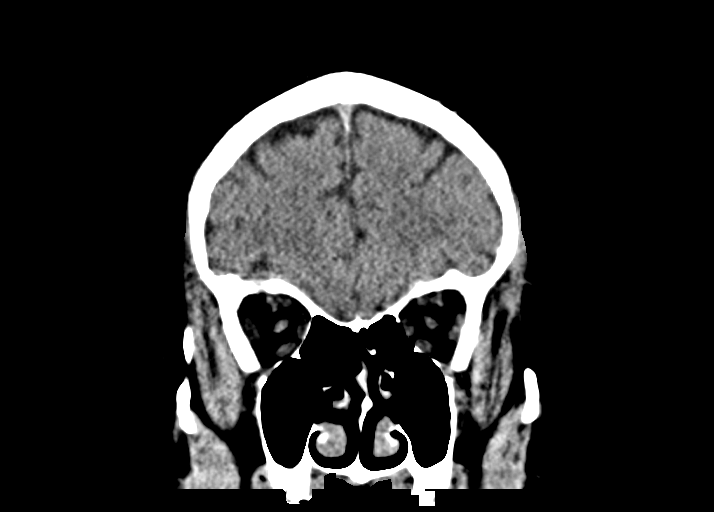
[im 34/77  brain]
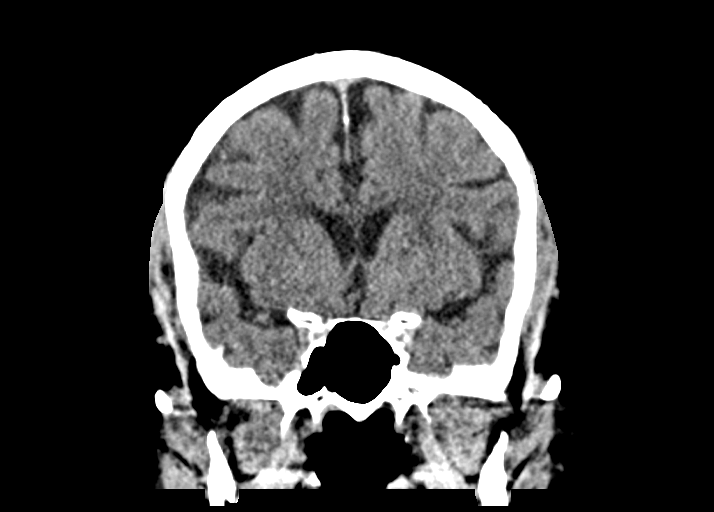
[im 43/77  brain]
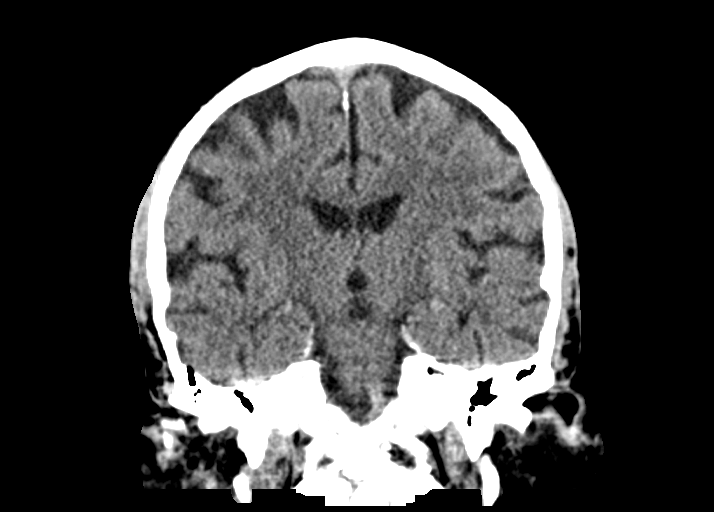

[Series 5: sag soft · sagittal · 0.34mm/px · 3 of 57 slices shown]
[im 19/57  brain]
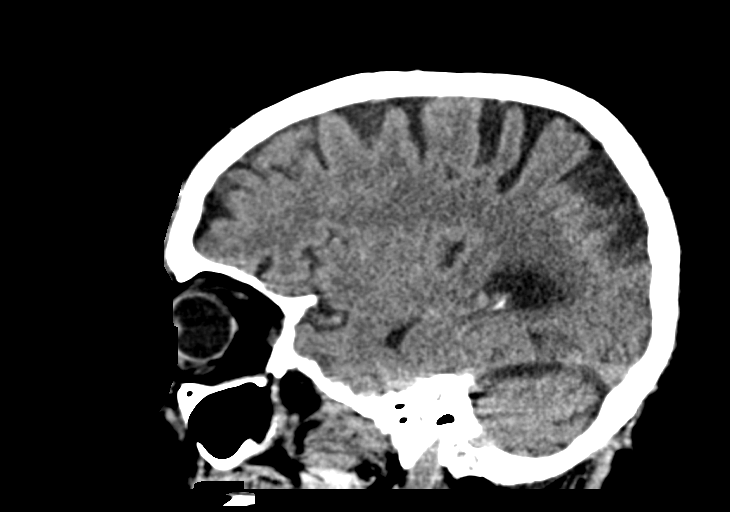
[im 29/57  brain]
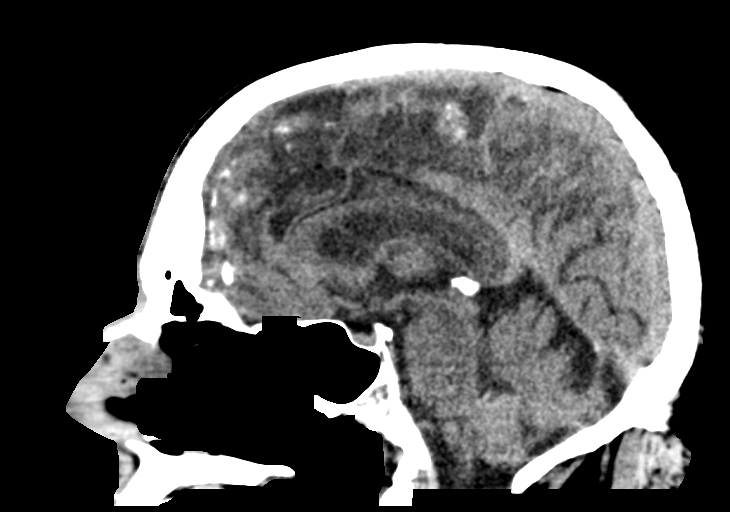
[im 38/57  brain]
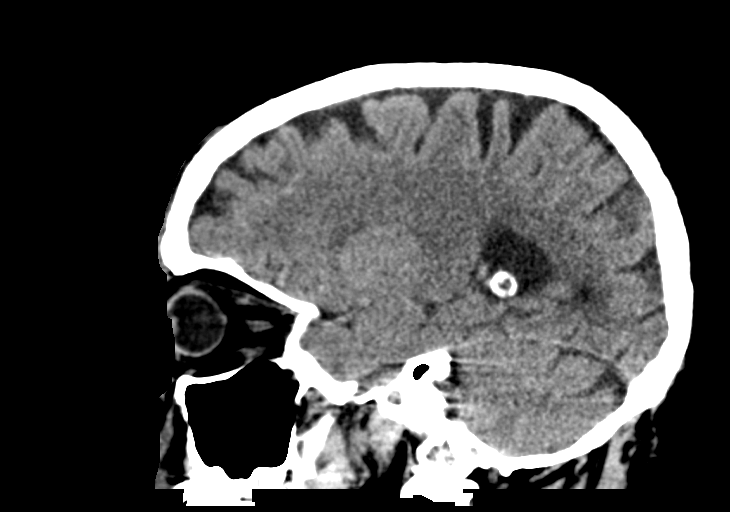

[14 of 47 positions shown; findings below may reference images not displayed]

FINDINGS: Brain: No mass lesion, intraparenchymal hemorrhage or extra-axial
collection. No evidence of acute cortical infarct. There is
periventricular hypoattenuation compatible with chronic
microvascular disease.

Vascular: No hyperdense vessel or unexpected calcification.

Skull: There is a frontal scalp abrasion. There is a minimally
medially displaced fracture of the anterior right nasal bone. No
calvarial fracture.

Sinuses/Orbits: No sinus fluid levels or advanced mucosal
thickening. No mastoid effusion. Normal orbits.
IMPRESSION: 1. No acute intracranial abnormality.
2. Frontal scalp and anterior nasal abrasion with fracture of the
anterior right nasal bone.
3. Chronic microvascular ischemia and volume loss.

## 2020-10-14 DEATH — deceased
